# Patient Record
Sex: Male | Born: 2000 | Race: Black or African American | Hispanic: No | Marital: Single | Smoking: Never smoker
Health system: Southern US, Community
[De-identification: ages and names within clinical notes are randomized; demographics above are authoritative.]

---
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
## 2019-04-06 IMAGING — CR DG CHEST 2V
2 series · 2 of 2 positions shown · non-contrast
Comparison: None.

CLINICAL DATA: Chest pain

EXAM:
CHEST  2 VIEW

[chest pa]
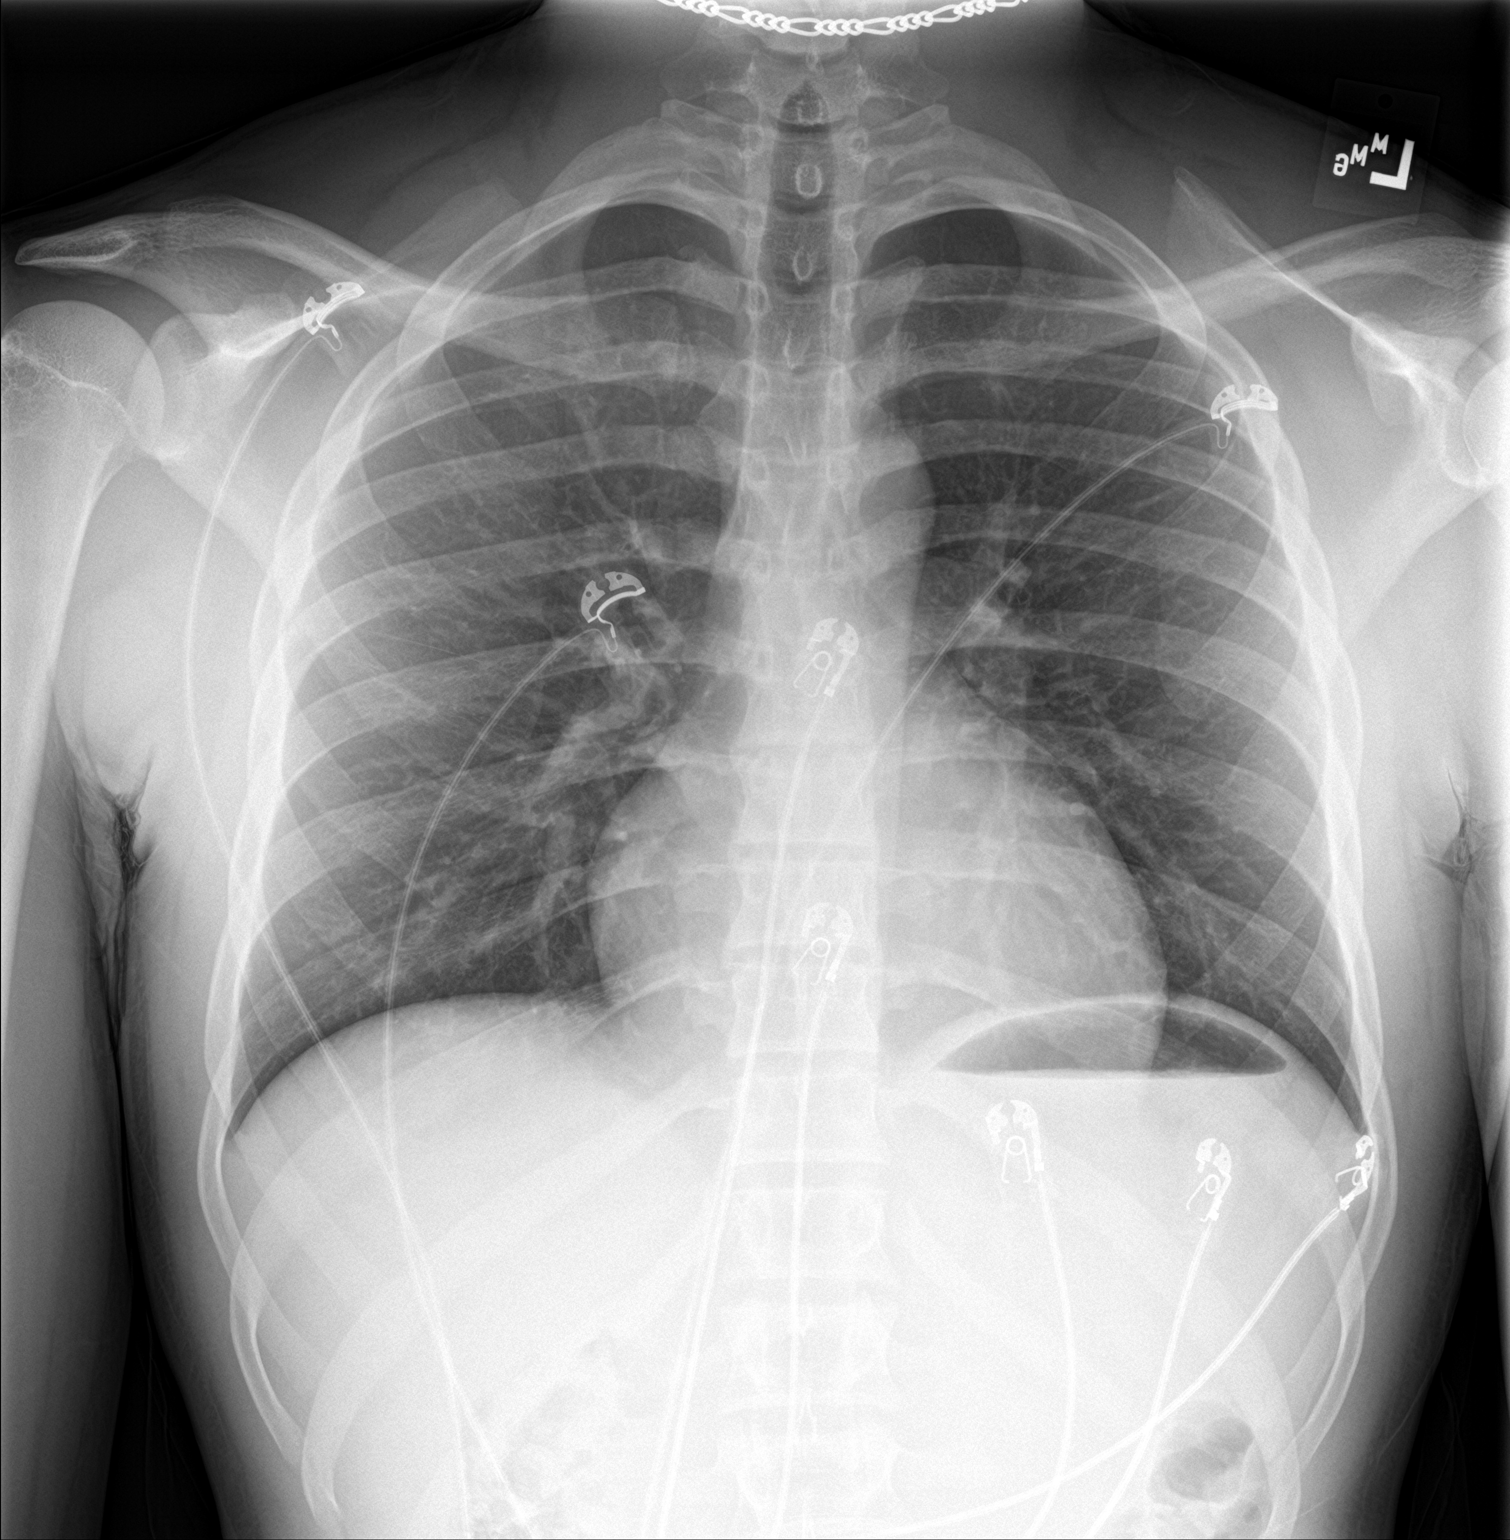

[chest lat]
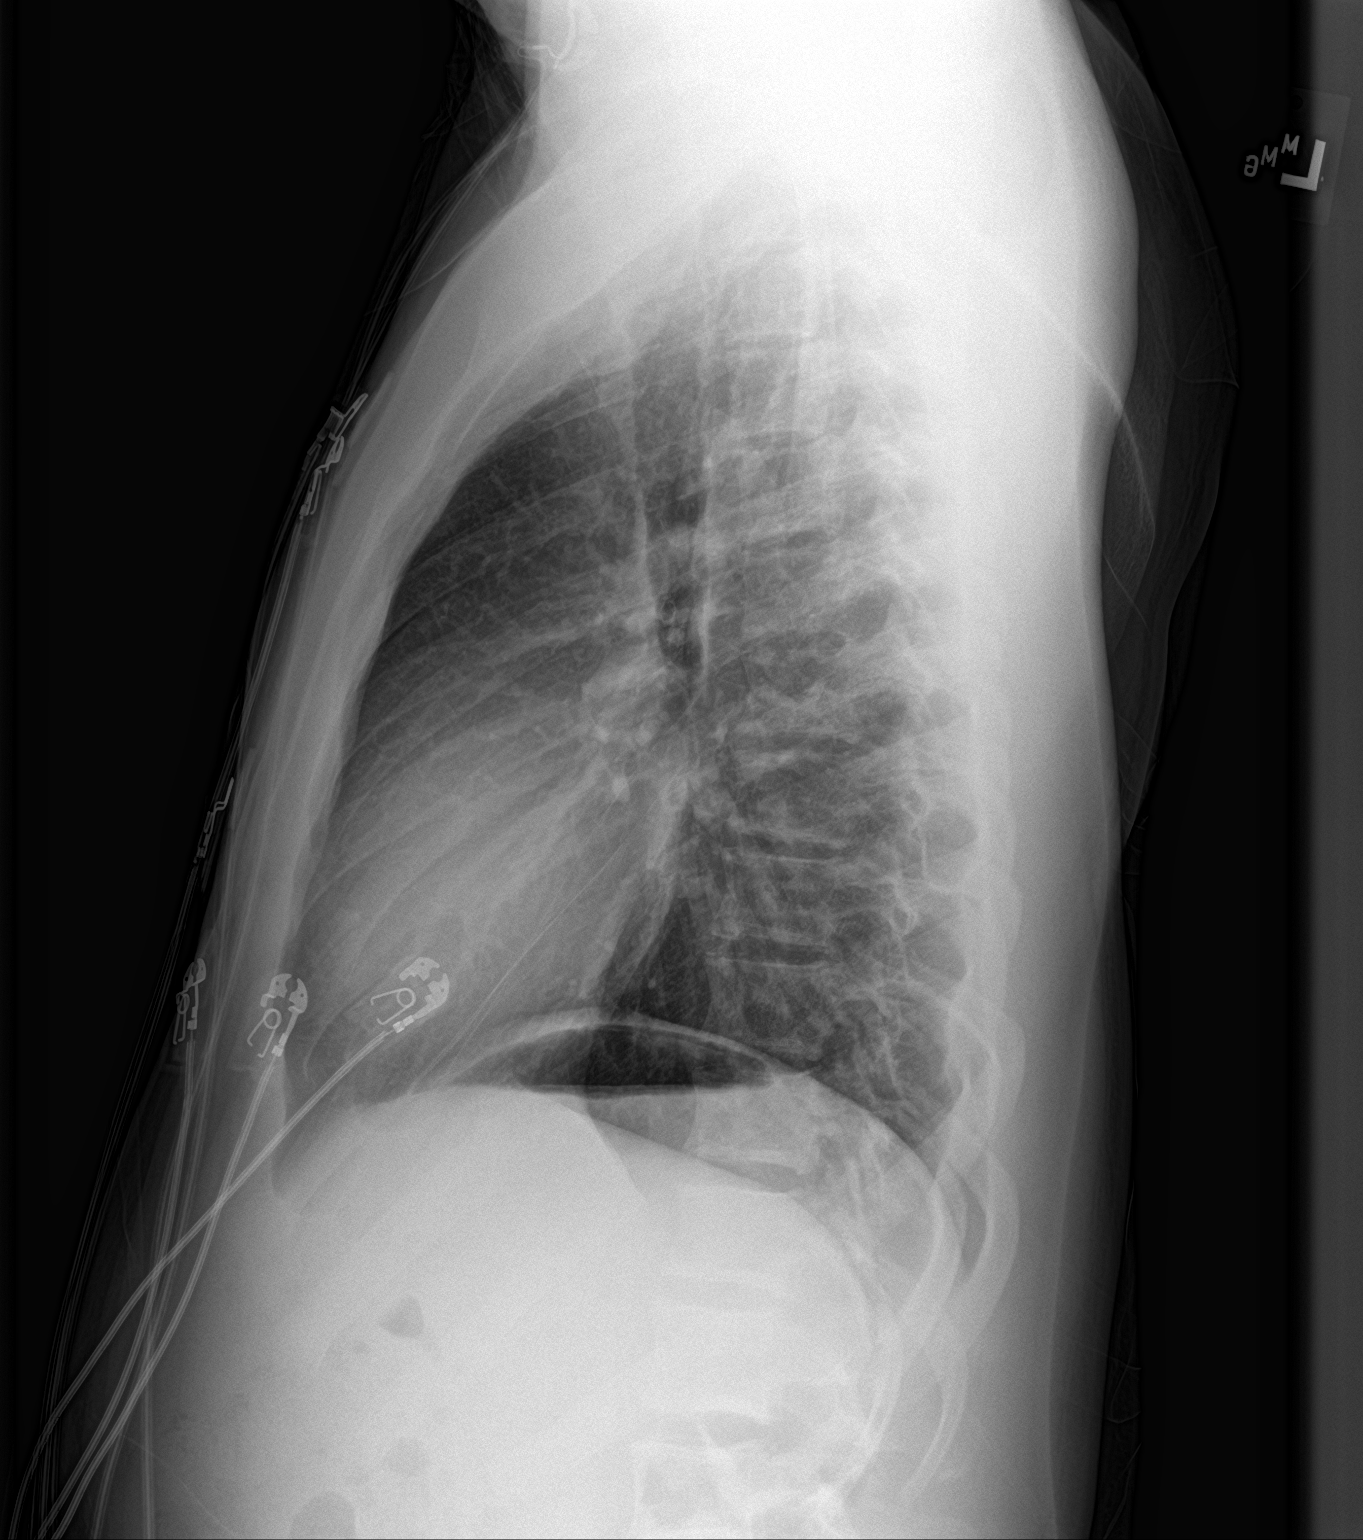

[2 of 2 positions shown; findings below may reference images not displayed]

FINDINGS: The heart size and mediastinal contours are within normal limits.
Both lungs are clear. The visualized skeletal structures are
unremarkable.
IMPRESSION: No active cardiopulmonary disease.

## 2020-05-21 ENCOUNTER — Ambulatory Visit: Payer: Self-pay | Attending: Internal Medicine

## 2020-05-21 DIAGNOSIS — Z23 Encounter for immunization: Secondary | ICD-10-CM

## 2020-05-21 NOTE — Progress Notes (Signed)
   Covid-19 Vaccination Clinic  Name:  Ronald Thomas    MRN: [id number] DOB: 08-22-2001  05/21/2020  [name] was observed post Covid-19 immunization for 15 minutes without incident. She was provided with Vaccine Information Sheet and instruction to access the V-Safe system.   [name] was instructed to call 911 with any severe reactions post vaccine: Marland Kitchen Difficulty breathing  . Swelling of face and throat  . A fast heartbeat  . A bad rash all over body  . Dizziness and weakness   Immunizations Administered    Name Date Dose VIS Date Route   Pfizer COVID-19 Vaccine 05/21/2020 11:10 AM 0.3 mL 02/10/2019 Intramuscular   Manufacturer: [employer], [employer]   Lot: [id number]   NDC: [address]-3

## 2020-06-13 ENCOUNTER — Ambulatory Visit: Payer: Self-pay | Attending: Internal Medicine

## 2020-06-13 DIAGNOSIS — Z23 Encounter for immunization: Secondary | ICD-10-CM

## 2020-06-13 NOTE — Progress Notes (Signed)
   Covid-19 Vaccination Clinic  Name:  KEYRON POKORSKI    MRN: [id number] DOB: 2001/10/20  06/13/2020  Mr. Aung was observed post Covid-19 immunization for 15 minutes without incident. He was provided with Vaccine Information Sheet and instruction to access the V-Safe system.   Mr. Flanigan was instructed to call 911 with any severe reactions post vaccine: Marland Kitchen Difficulty breathing  . Swelling of face and throat  . A fast heartbeat  . A bad rash all over body  . Dizziness and weakness   Immunizations Administered    Name Date Dose VIS Date Route   Pfizer COVID-19 Vaccine 06/13/2020 11:04 AM 0.3 mL 02/10/2019 Intramuscular   Manufacturer: [employer], [employer]   Lot: [id number]   NDC: [address]-8

## 2020-12-05 ENCOUNTER — Other Ambulatory Visit: Payer: Self-pay

## 2020-12-05 ENCOUNTER — Ambulatory Visit (INDEPENDENT_AMBULATORY_CARE_PROVIDER_SITE_OTHER): Payer: 59 | Admitting: Family Medicine

## 2020-12-05 ENCOUNTER — Encounter: Payer: Self-pay | Admitting: Family Medicine

## 2020-12-05 DIAGNOSIS — Z823 Family history of stroke: Secondary | ICD-10-CM | POA: Diagnosis not present

## 2020-12-05 DIAGNOSIS — L709 Acne, unspecified: Secondary | ICD-10-CM

## 2020-12-05 NOTE — Assessment & Plan Note (Signed)
Minocycline for the past 6 yrs through derm. Seems to be tolerating well.  Reviewed photosensitivity effect.

## 2020-12-05 NOTE — Assessment & Plan Note (Signed)
Consider FLP next visit.

## 2020-12-05 NOTE — Patient Instructions (Addendum)
You are doing well today Return as needed or in 1 year for physical.  Good luck with school.

## 2020-12-05 NOTE — Progress Notes (Signed)
Patient ID: Ronald Thomas, male    DOB: [birth year], 19 y.o.   MRN: [id number]  This visit was conducted in person.  BP 120/68 (BP Location: Left Arm, Patient Position: Sitting, Cuff Size: Normal)   Pulse 62   Temp 97.8 F (36.6 C) (Temporal)   Ht 6' (1.829 m)   Wt 166 lb 7 oz (75.5 kg)   SpO2 97%   BMI 22.57 kg/m    CC: new pt to establish care Subjective:   HPI: Ronald Thomas is a 19 y.o. male presenting on 12/05/2020 for New Patient (Initial Visit)   Previously saw Dr Donnie Coffin pediatrician. Transitioning to adult practice.   On minocycline for acne started 2015 as well as cetaphil bar of soap. Initially prescribed by derm.   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  Enjoys basketball, lifting weights, enjoys talking to GF at [facility].   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 Interim medical history since our last visit reviewed. Allergies and medications reviewed and updated. Outpatient Medications Prior to Visit  Medication Sig Dispense Refill  . minocycline (MINOCIN,DYNACIN) 100 MG capsule Take 100 mg by mouth 2 (two) times daily.     No facility-administered medications prior to visit.    Past Medical History:  Diagnosis Date  . History of chicken pox 2006   Past Surgical History:  Procedure Laterality Date  . 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 Stroke Mother   . Stroke Maternal Grandmother   . Diabetes Maternal Grandfather   . High blood pressure Maternal Grandfather   . Heart attack Paternal Grandmother   . Diabetes Paternal Grandfather   . Heart attack Paternal Grandfather   . Cancer Paternal Aunt      Social History   Tobacco Use  . Smoking status: Never Smoker  . Smokeless tobacco: Never Used  Substance Use Topics  . Alcohol use: Never  . Drug use: Not Currently    Types: Marijuana    Per HPI unless specifically indicated in ROS section below Review of Systems Objective:  BP 120/68 (BP Location: Left Arm, Patient Position: Sitting, Cuff Size: Normal)   Pulse 62   Temp 97.8 F (36.6 C) (Temporal)   Ht 6' (1.829 m)   Wt 166 lb 7 oz (75.5 kg)   SpO2 97%   BMI 22.57 kg/m   Wt Readings from Last 3 Encounters:  12/05/20 166 lb 7 oz (75.5 kg)  12/28/17 187 lb 13.3 oz (85.2 kg) (90 %, Z= 1.30)*   * Growth percentiles are based on CDC (Boys, 2-20 Years) data.      Physical Exam Vitals and nursing note reviewed.  Constitutional:      Appearance: Normal appearance. He is not ill-appearing.  HENT:     Head: Normocephalic and atraumatic.  Eyes:     Extraocular Movements: Extraocular movements intact.     Pupils: Pupils are equal, round, and reactive to light.  Cardiovascular:     Rate and Rhythm: Normal rate and regular rhythm.     Pulses: Normal pulses.     Heart sounds: Normal heart sounds. No murmur  heard.   Pulmonary:     Effort: Pulmonary effort is normal. No respiratory distress.     Breath sounds: Normal breath sounds. No wheezing, rhonchi or rales.  Abdominal:     General: Abdomen is flat. Bowel sounds are normal. There is no distension.     Palpations: Abdomen is soft. There is no mass.     Tenderness: There is no abdominal tenderness. There is no guarding or rebound.     Hernia: No hernia is present.  Musculoskeletal:     Right lower leg: No edema.     Left lower leg: No edema.  Skin:    General: Skin is warm and dry.     Findings: No rash.  Neurological:     Mental Status: He is alert.  Psychiatric:        Mood and Affect: Mood normal.        Behavior: Behavior normal.       No results found for this or any previous visit. Assessment & Plan:   This visit occurred during the SARS-CoV-2 public health emergency.  Safety protocols were in place, including screening questions prior to the visit, additional usage of staff PPE, and extensive cleaning of exam room while observing appropriate contact time as indicated for disinfecting solutions.   Problem List Items Addressed This Visit    Family history of cerebrovascular accident (CVA) in mother    Consider FLP next visit.       Acne    Minocycline for the past 6 yrs through derm. Seems to be tolerating well.  Reviewed photosensitivity effect.           No orders of the defined types were placed in this encounter.  No orders of the defined types were placed in this encounter.   Patient Instructions  You are doing well today Return as needed or in 1 year for physical.  Good luck with school.    Follow up plan: Return in about 1 year (around 12/05/2021) for annual exam, prior fasting for blood work.  Eustaquio Boyden, MD

## 2020-12-26 ENCOUNTER — Other Ambulatory Visit: Payer: Self-pay

## 2020-12-26 DIAGNOSIS — Z20822 Contact with and (suspected) exposure to covid-19: Secondary | ICD-10-CM

## 2020-12-28 LAB — SARS-COV-2, NAA 2 DAY TAT

## 2020-12-28 LAB — NOVEL CORONAVIRUS, NAA: SARS-CoV-2, NAA: NOT DETECTED

## 2021-09-08 ENCOUNTER — Ambulatory Visit: Payer: 59 | Admitting: Family Medicine

## 2021-12-17 DIAGNOSIS — S99199A Other physeal fracture of unspecified metatarsal, initial encounter for closed fracture: Secondary | ICD-10-CM

## 2021-12-17 HISTORY — DX: Other physeal fracture of unspecified metatarsal, initial encounter for closed fracture: S99.199A

## 2022-01-18 ENCOUNTER — Other Ambulatory Visit (HOSPITAL_COMMUNITY): Payer: Self-pay | Admitting: Orthopedic Surgery

## 2022-01-19 ENCOUNTER — Other Ambulatory Visit: Payer: Self-pay

## 2022-01-19 ENCOUNTER — Encounter (HOSPITAL_BASED_OUTPATIENT_CLINIC_OR_DEPARTMENT_OTHER): Payer: Self-pay | Admitting: Orthopedic Surgery

## 2022-01-24 NOTE — Progress Notes (Signed)

## 2022-01-25 ENCOUNTER — Ambulatory Visit (HOSPITAL_BASED_OUTPATIENT_CLINIC_OR_DEPARTMENT_OTHER): Payer: BC Managed Care – PPO

## 2022-01-25 ENCOUNTER — Other Ambulatory Visit: Payer: Self-pay

## 2022-01-25 ENCOUNTER — Encounter (HOSPITAL_BASED_OUTPATIENT_CLINIC_OR_DEPARTMENT_OTHER): Payer: Self-pay | Admitting: Orthopedic Surgery

## 2022-01-25 ENCOUNTER — Encounter (HOSPITAL_BASED_OUTPATIENT_CLINIC_OR_DEPARTMENT_OTHER)
Admission: RE | Disposition: A | Discharge status: Home / Self Care | Payer: Self-pay | Source: Ambulatory Visit | Attending: Orthopedic Surgery

## 2022-01-25 ENCOUNTER — Ambulatory Visit (HOSPITAL_BASED_OUTPATIENT_CLINIC_OR_DEPARTMENT_OTHER)
Admission: RE | Admit: 2022-01-25 | Discharge: 2022-01-25 | Disposition: A | Discharge status: Home / Self Care | Payer: BC Managed Care – PPO | Source: Ambulatory Visit | Attending: Orthopedic Surgery | Admitting: Orthopedic Surgery

## 2022-01-25 ENCOUNTER — Ambulatory Visit (HOSPITAL_BASED_OUTPATIENT_CLINIC_OR_DEPARTMENT_OTHER): Payer: BC Managed Care – PPO | Admitting: Anesthesiology

## 2022-01-25 DIAGNOSIS — S92351A Displaced fracture of fifth metatarsal bone, right foot, initial encounter for closed fracture: Secondary | ICD-10-CM

## 2022-01-25 DIAGNOSIS — X58XXXA Exposure to other specified factors, initial encounter: Secondary | ICD-10-CM | POA: Diagnosis not present

## 2022-01-25 DIAGNOSIS — S92351D Displaced fracture of fifth metatarsal bone, right foot, subsequent encounter for fracture with routine healing: Secondary | ICD-10-CM

## 2022-01-25 DIAGNOSIS — Y9367 Activity, basketball: Secondary | ICD-10-CM | POA: Diagnosis not present

## 2022-01-25 HISTORY — PX: ORIF TOE FRACTURE: SHX5032

## 2022-01-25 SURGERY — OPEN REDUCTION INTERNAL FIXATION (ORIF) METATARSAL (TOE) FRACTURE
Anesthesia: General | Site: Foot | Laterality: Right

## 2022-01-25 MED ORDER — CEFAZOLIN SODIUM-DEXTROSE 2-4 GM/100ML-% IV SOLN
2.0000 g | INTRAVENOUS | Status: AC
  Administered 2022-01-25: 2 g via INTRAVENOUS

## 2022-01-25 MED ORDER — FENTANYL CITRATE (PF) 100 MCG/2ML IJ SOLN
INTRAMUSCULAR | Status: DC | PRN
  Administered 2022-01-25 (×3): 25 ug via INTRAVENOUS
  Administered 2022-01-25: 50 ug via INTRAVENOUS

## 2022-01-25 MED ORDER — LACTATED RINGERS IV SOLN: INTRAVENOUS | Status: DC

## 2022-01-25 MED ORDER — BUPIVACAINE-EPINEPHRINE 0.5% -1:200000 IJ SOLN
INTRAMUSCULAR | Status: DC | PRN
  Administered 2022-01-25: 10 mL

## 2022-01-25 MED ORDER — DEXAMETHASONE SODIUM PHOSPHATE 10 MG/ML IJ SOLN
INTRAMUSCULAR | Status: DC | PRN
  Administered 2022-01-25: 4 mg via INTRAVENOUS

## 2022-01-25 MED ORDER — FENTANYL CITRATE (PF) 100 MCG/2ML IJ SOLN
INTRAMUSCULAR | Status: AC
  Filled 2022-01-25: qty 2

## 2022-01-25 MED ORDER — ONDANSETRON HCL 4 MG/2ML IJ SOLN
INTRAMUSCULAR | Status: DC | PRN
  Administered 2022-01-25: 4 mg via INTRAVENOUS

## 2022-01-25 MED ORDER — VANCOMYCIN HCL 500 MG IV SOLR
INTRAVENOUS | Status: AC
  Filled 2022-01-25: qty 10

## 2022-01-25 MED ORDER — ACETAMINOPHEN 500 MG PO TABS
ORAL_TABLET | ORAL | Status: AC
  Filled 2022-01-25: qty 2

## 2022-01-25 MED ORDER — MIDAZOLAM HCL 5 MG/5ML IJ SOLN
INTRAMUSCULAR | Status: DC | PRN
  Administered 2022-01-25: 2 mg via INTRAVENOUS

## 2022-01-25 MED ORDER — AMISULPRIDE (ANTIEMETIC) 5 MG/2ML IV SOLN: 10.0000 mg | Freq: Once | INTRAVENOUS | Status: DC | PRN

## 2022-01-25 MED ORDER — PROPOFOL 500 MG/50ML IV EMUL
INTRAVENOUS | Status: DC | PRN
  Administered 2022-01-25: 25 ug/kg/min via INTRAVENOUS

## 2022-01-25 MED ORDER — CEFAZOLIN SODIUM-DEXTROSE 2-4 GM/100ML-% IV SOLN
INTRAVENOUS | Status: AC
  Filled 2022-01-25: qty 100

## 2022-01-25 MED ORDER — ACETAMINOPHEN 500 MG PO TABS
1000.0000 mg | ORAL_TABLET | Freq: Once | ORAL | Status: AC
  Administered 2022-01-25: 1000 mg via ORAL

## 2022-01-25 MED ORDER — 0.9 % SODIUM CHLORIDE (POUR BTL) OPTIME
TOPICAL | Status: DC | PRN
  Administered 2022-01-25: 120 mL

## 2022-01-25 MED ORDER — PROPOFOL 10 MG/ML IV BOLUS
INTRAVENOUS | Status: DC | PRN
  Administered 2022-01-25: 200 mg via INTRAVENOUS

## 2022-01-25 MED ORDER — OXYCODONE HCL 5 MG PO TABS: 5.0000 mg | ORAL_TABLET | Freq: Four times a day (QID) | ORAL | 0 refills | Status: AC | PRN

## 2022-01-25 MED ORDER — OXYCODONE HCL 5 MG PO TABS
ORAL_TABLET | ORAL | Status: AC
  Filled 2022-01-25: qty 1

## 2022-01-25 MED ORDER — CELECOXIB 200 MG PO CAPS
ORAL_CAPSULE | ORAL | Status: AC
  Filled 2022-01-25: qty 1

## 2022-01-25 MED ORDER — OXYCODONE HCL 5 MG PO TABS
5.0000 mg | ORAL_TABLET | Freq: Once | ORAL | Status: AC
Start: 2022-01-25 — End: 2022-01-25
  Administered 2022-01-25: 5 mg via ORAL

## 2022-01-25 MED ORDER — MIDAZOLAM HCL 2 MG/2ML IJ SOLN
INTRAMUSCULAR | Status: AC
  Filled 2022-01-25: qty 2

## 2022-01-25 MED ORDER — CELECOXIB 200 MG PO CAPS
200.0000 mg | ORAL_CAPSULE | Freq: Once | ORAL | Status: AC
  Administered 2022-01-25: 200 mg via ORAL

## 2022-01-25 MED ORDER — PROMETHAZINE HCL 25 MG/ML IJ SOLN: 6.2500 mg | INTRAMUSCULAR | Status: DC | PRN

## 2022-01-25 MED ORDER — SODIUM CHLORIDE 0.9 % IV SOLN: INTRAVENOUS | Status: DC

## 2022-01-25 MED ORDER — VANCOMYCIN HCL 500 MG IV SOLR
INTRAVENOUS | Status: DC | PRN
  Administered 2022-01-25: 500 mg

## 2022-01-25 MED ORDER — FENTANYL CITRATE (PF) 100 MCG/2ML IJ SOLN
25.0000 ug | INTRAMUSCULAR | Status: DC | PRN
  Administered 2022-01-25 (×2): 50 ug via INTRAVENOUS

## 2022-01-25 MED ORDER — LIDOCAINE 2% (20 MG/ML) 5 ML SYRINGE
INTRAMUSCULAR | Status: DC | PRN
Start: 2022-01-25 — End: 2022-01-25
  Administered 2022-01-25: 60 mg via INTRAVENOUS

## 2022-01-25 SURGICAL SUPPLY — 72 items
APL PRP STRL LF DISP 70% ISPRP (MISCELLANEOUS) ×1
BANDAGE ESMARK 6X9 LF (GAUZE/BANDAGES/DRESSINGS) IMPLANT
BIT DRILL CANNULATED 3.8MM (DRILL) IMPLANT
BLADE SURG 15 STRL LF DISP TIS (BLADE) ×4 IMPLANT
BLADE SURG 15 STRL SS (BLADE) ×4
BNDG CMPR 9X4 STRL LF SNTH (GAUZE/BANDAGES/DRESSINGS)
BNDG CMPR 9X6 STRL LF SNTH (GAUZE/BANDAGES/DRESSINGS)
BNDG COHESIVE 4X5 TAN ST LF (GAUZE/BANDAGES/DRESSINGS) ×3 IMPLANT
BNDG COHESIVE 6X5 TAN ST LF (GAUZE/BANDAGES/DRESSINGS) IMPLANT
BNDG CONFORM 2 STRL LF (GAUZE/BANDAGES/DRESSINGS) ×3 IMPLANT
BNDG ELASTIC 4X5.8 VLCR STR LF (GAUZE/BANDAGES/DRESSINGS) IMPLANT
BNDG ESMARK 4X9 LF (GAUZE/BANDAGES/DRESSINGS) IMPLANT
BNDG ESMARK 6X9 LF (GAUZE/BANDAGES/DRESSINGS)
BOOT STEPPER DURA LG (SOFTGOODS) ×1 IMPLANT
BOOT STEPPER DURA MED (SOFTGOODS) IMPLANT
CANISTER SUCT 1200ML W/VALVE (MISCELLANEOUS) ×3 IMPLANT
CHLORAPREP W/TINT 26 (MISCELLANEOUS) ×3 IMPLANT
COUNTER SINK 5.5 (MISCELLANEOUS) ×2
COVER BACK TABLE 60X90IN (DRAPES) ×3 IMPLANT
CUFF TOURN SGL QUICK 34 (TOURNIQUET CUFF)
CUFF TRNQT CYL 34X4.125X (TOURNIQUET CUFF) IMPLANT
DRAPE EXTREMITY T 121X128X90 (DISPOSABLE) ×3 IMPLANT
DRAPE OEC MINIVIEW 54X84 (DRAPES) ×3 IMPLANT
DRAPE U-SHAPE 47X51 STRL (DRAPES) ×3 IMPLANT
DRILL CANNULATED 3.8MM (DRILL) ×2
DRSG MEPITEL 4X7.2 (GAUZE/BANDAGES/DRESSINGS) ×3 IMPLANT
DRSG PAD ABDOMINAL 8X10 ST (GAUZE/BANDAGES/DRESSINGS) ×6 IMPLANT
ELECT REM PT RETURN 9FT ADLT (ELECTROSURGICAL) ×2
ELECTRODE REM PT RTRN 9FT ADLT (ELECTROSURGICAL) ×2 IMPLANT
GAUZE SPONGE 4X4 12PLY STRL (GAUZE/BANDAGES/DRESSINGS) ×3 IMPLANT
GLOVE SRG 8 PF TXTR STRL LF DI (GLOVE) ×4 IMPLANT
GLOVE SURG ENC MOIS LTX SZ8 (GLOVE) ×3 IMPLANT
GLOVE SURG LTX SZ8 (GLOVE) ×3 IMPLANT
GLOVE SURG UNDER POLY LF SZ8 (GLOVE) ×4
GOWN STRL REUS W/ TWL LRG LVL3 (GOWN DISPOSABLE) ×2 IMPLANT
GOWN STRL REUS W/ TWL XL LVL3 (GOWN DISPOSABLE) ×4 IMPLANT
GOWN STRL REUS W/TWL LRG LVL3 (GOWN DISPOSABLE) ×2
GOWN STRL REUS W/TWL XL LVL3 (GOWN DISPOSABLE) ×4
NEEDLE HYPO 22GX1.5 SAFETY (NEEDLE) IMPLANT
NS IRRIG 1000ML POUR BTL (IV SOLUTION) ×3 IMPLANT
PACK BASIN DAY SURGERY FS (CUSTOM PROCEDURE TRAY) ×3 IMPLANT
PAD CAST 4YDX4 CTTN HI CHSV (CAST SUPPLIES) ×2 IMPLANT
PADDING CAST ABS 4INX4YD NS (CAST SUPPLIES)
PADDING CAST ABS COTTON 4X4 ST (CAST SUPPLIES) IMPLANT
PADDING CAST COTTON 4X4 STRL (CAST SUPPLIES) ×2
PADDING CAST COTTON 6X4 STRL (CAST SUPPLIES) IMPLANT
PENCIL SMOKE EVACUATOR (MISCELLANEOUS) ×3 IMPLANT
SANITIZER HAND PURELL 535ML FO (MISCELLANEOUS) ×3 IMPLANT
SCREW COUNTERSINK 5.5 (MISCELLANEOUS) IMPLANT
SCREW SOLID MONSTER BT 5.5X40 (Screw) ×1 IMPLANT
SHEET MEDIUM DRAPE 40X70 STRL (DRAPES) ×3 IMPLANT
SLEEVE SCD COMPRESS KNEE MED (STOCKING) ×3 IMPLANT
SPIKE FLUID TRANSFER (MISCELLANEOUS) IMPLANT
SPONGE T-LAP 18X18 ~~LOC~~+RFID (SPONGE) ×3 IMPLANT
STOCKINETTE 6  STRL (DRAPES) ×2
STOCKINETTE 6 STRL (DRAPES) ×2 IMPLANT
SUCTION FRAZIER HANDLE 10FR (MISCELLANEOUS) ×2
SUCTION TUBE FRAZIER 10FR DISP (MISCELLANEOUS) ×2 IMPLANT
SUT ETHILON 3 0 PS 1 (SUTURE) ×3 IMPLANT
SUT FIBERWIRE #2 38 T-5 BLUE (SUTURE)
SUT MNCRL AB 3-0 PS2 18 (SUTURE) IMPLANT
SUT VIC AB 2-0 SH 27 (SUTURE)
SUT VIC AB 2-0 SH 27XBRD (SUTURE) IMPLANT
SUT VICRYL 0 SH 27 (SUTURE) IMPLANT
SUTURE FIBERWR #2 38 T-5 BLUE (SUTURE) IMPLANT
SYR BULB EAR ULCER 3OZ GRN STR (SYRINGE) ×3 IMPLANT
SYR CONTROL 10ML LL (SYRINGE) IMPLANT
TAP 5.5 (TAP) ×1 IMPLANT
TOWEL GREEN STERILE FF (TOWEL DISPOSABLE) ×6 IMPLANT
TUBE CONNECTING 20X1/4 (TUBING) ×3 IMPLANT
UNDERPAD 30X36 HEAVY ABSORB (UNDERPADS AND DIAPERS) ×3 IMPLANT
WIRE SMOOTH NITINOL 1.6X200 (WIRE) ×1 IMPLANT

## 2022-01-25 NOTE — Anesthesia Procedure Notes (Signed)
Procedure Name: LMA Insertion Date/Time: 01/25/2022 12:13 PM Performed by: Sheryn Bison, CRNA Pre-anesthesia Checklist: Patient identified, Emergency Drugs available, Suction available and Patient being monitored Patient Re-evaluated:Patient Re-evaluated prior to induction Oxygen Delivery Method: Circle System Utilized Preoxygenation: Pre-oxygenation with 100% oxygen Induction Type: IV induction Ventilation: Mask ventilation without difficulty LMA: LMA inserted LMA Size: 4.0 Number of attempts: 1 Airway Equipment and Method: bite block Placement Confirmation: positive ETCO2 Tube secured with: Tape Dental Injury: Teeth and Oropharynx as per pre-operative assessment

## 2022-01-25 NOTE — Discharge Instructions (Addendum)
Toni Arthurs, MD [facility]  Please read the following information regarding your care after surgery.  Medications  You only need a prescription for the narcotic pain medicine (ex. oxycodone, Percocet, Norco).  All of the other medicines listed below are available over the counter. X Aleve 2 pills twice a day for the first 3 days after surgery. X acetominophen (Tylenol) 650 mg every 4-6 hours as you need for minor to moderate pain X oxycodone as prescribed for severe pain  Narcotic pain medicine (ex. oxycodone, Percocet, Vicodin) will cause constipation.  To prevent this problem, take the following medicines while you are taking any pain medicine. X docusate sodium (Colace) 100 mg twice a day X senna (Senokot) 2 tablets twice a day  Weight Bearing X Bear weight only on your operated foot in the cam boot.  Cast / Splint / Dressing X Keep your splint, cast or dressing clean and dry.  Dont put anything (coat hanger, pencil, etc) down inside of it.  If it gets damp, use a hair dryer on the cool setting to dry it.  If it gets soaked, call the office to schedule an appointment for a cast change.  After your dressing, cast or splint is removed; you may shower, but do not soak or scrub the wound.  Allow the water to run over it, and then gently pat it dry.  Swelling It is normal for you to have swelling where you had surgery.  To reduce swelling and pain, keep your toes above your nose for at least 3 days after surgery.  It may be necessary to keep your foot or leg elevated for several weeks.  If it hurts, it should be elevated.  Follow Up Call my office at [phone] when you are discharged from the hospital or surgery center to schedule an appointment to be seen two weeks after surgery.  Call my office at [phone] if you develop a fever >101.5 F, nausea, vomiting, bleeding from the surgical site or severe pain.     Post Anesthesia Home Care Instructions  Activity: Get plenty of  rest for the remainder of the day. A responsible individual must stay with you for 24 hours following the procedure.  For the next 24 hours, DO NOT: -Drive a car -Advertising copywriter -Drink alcoholic beverages -Take any medication unless instructed by your physician -Make any legal decisions or sign important papers.  Meals: Start with liquid foods such as gelatin or soup. Progress to regular foods as tolerated. Avoid greasy, spicy, heavy foods. If nausea and/or vomiting occur, drink only clear liquids until the nausea and/or vomiting subsides. Call your physician if vomiting continues.  Special Instructions/Symptoms: Your throat may feel dry or sore from the anesthesia or the breathing tube placed in your throat during surgery. If this causes discomfort, gargle with warm salt water. The discomfort should disappear within 24 hours.  If you had a scopolamine patch placed behind your ear for the management of post- operative nausea and/or vomiting:  1. The medication in the patch is effective for 72 hours, after which it should be removed.  Wrap patch in a tissue and discard in the trash. Wash hands thoroughly with soap and water. 2. You may remove the patch earlier than 72 hours if you experience unpleasant side effects which may include dry mouth, dizziness or visual disturbances. 3. Avoid touching the patch. Wash your hands with soap and water after contact with the patch.    Next tylenol dose 4pm next motrin 6pm

## 2022-01-25 NOTE — Transfer of Care (Signed)
Immediate Anesthesia Transfer of Care Note  Patient: Ronald Thomas  Procedure(s) Performed: Open reduction internal fixation Right Fifth metatarsal jones fracture (Right: Foot)  Patient Location: PACU  Anesthesia Type:General  Level of Consciousness: drowsy and patient cooperative  Airway & Oxygen Therapy: Patient Spontanous Breathing and Patient connected to face mask oxygen  Post-op Assessment: Report given to RN and Post -op Vital signs reviewed and stable  Post vital signs: Reviewed and stable  Last Vitals:  Vitals Value Taken Time  BP 112/47 01/25/22 1303  Temp    Pulse 56 01/25/22 1304  Resp 11 01/25/22 1304  SpO2 100 % 01/25/22 1304  Vitals shown include unvalidated device data.  Last Pain:  Vitals:   01/25/22 1029  TempSrc: Oral  PainSc: 0-No pain      Patients Stated Pain Goal: 8 (01/25/22 1029)  Complications: No notable events documented.

## 2022-01-25 NOTE — H&P (Signed)
Ronald Thomas is an 21 y.o. male.   Chief Complaint: Right foot pain HPI: 21 year old male without significant past medical history has a displaced fracture of the fifth metatarsal base in zone 2 after a basketball injury.  He presents today for surgical treatment of this displaced fracture.  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 He has never used smokeless tobacco. He reports that he does not currently use drugs after having used the following drugs: Marijuana. He reports that he does not drink alcohol.  Allergies: No Known Allergies  Medications Prior to Admission  Medication Sig Dispense Refill   HYDROcodone-acetaminophen (NORCO/VICODIN) 5-325 MG tablet Take 1 tablet by mouth every 6 (six) hours as needed for moderate pain.      No results found for this or any previous visit (from the past 48 hour(s)). No results found.  Review of Systems no recent fever, chills, nausea, vomiting or changes in his appetite  Blood pressure (!) 153/87, pulse 70, temperature 99 F (37.2 C), temperature source Oral, resp. rate 20, height 6' (1.829 m), weight 77.7 kg, SpO2 100 %. Physical Exam  Well-nourished well-developed male in no apparent distress.  Alert and oriented.  Normal mood and affect.  Gait is nonweightbearing on the right.  The right ankle has some swelling laterally.  Skin is healthy and intact.  Pulses are palpable.  No lymphadenopathy.  Intact  sensibility to light touch in the sural nerve distribution.   Assessment/Plan Right fifth metatarsal base zone 2 fracture -to the operating room today for open treatment with intramedullary fixation.  The risks and benefits of the alternative treatment options have been discussed in detail.  The patient wishes to proceed with surgery and specifically understands risks of bleeding, infection, nerve damage, blood clots, need for additional surgery, amputation and death.   Toni Arthurs, MD 02/04/22, 11:42 AM

## 2022-01-25 NOTE — Anesthesia Postprocedure Evaluation (Signed)
Anesthesia Post Note  Patient: Ronald Thomas  Procedure(s) Performed: Open reduction internal fixation Right Fifth metatarsal jones fracture (Right: Foot)     Patient location during evaluation: PACU Anesthesia Type: General Level of consciousness: sedated Pain management: pain level controlled Vital Signs Assessment: post-procedure vital signs reviewed and stable Respiratory status: spontaneous breathing and respiratory function stable Cardiovascular status: stable Postop Assessment: no apparent nausea or vomiting Anesthetic complications: no   No notable events documented.  Last Vitals:  Vitals:   01/25/22 1330 01/25/22 1345  BP: (!) 120/57 128/60  Pulse: (!) 41 (!) 53  Resp: 12 13  Temp:    SpO2: 100% 100%    Last Pain:  Vitals:   01/25/22 1345  TempSrc:   PainSc: 5                  [name]

## 2022-01-25 NOTE — Op Note (Signed)
01/25/2022  1:13 PM  PATIENT:  Ronald Thomas  21 y.o. male  PRE-OPERATIVE DIAGNOSIS:  Right 5th metatarsal fracture  POST-OPERATIVE DIAGNOSIS:  Right 5th metatarsal fracture  Procedure(s):  1.  Open treatment right fifth metatarsal fracture with internal fixation 2.  AP, lateral and oblique radiographs of the right foot  SURGEON:  Toni Arthurs, MD  ASSISTANT: None  ANESTHESIA:   General  EBL:  minimal   TOURNIQUET: Approximately 30 minutes with an ankle Esmarch  COMPLICATIONS:  None apparent  DISPOSITION:  Extubated, awake and stable to recovery.  INDICATION FOR PROCEDURE: 21 year old male without significant past medical history injured his right foot playing basketball about 2 weeks ago.  Radiographs reveal a displaced zone 2 fracture of the fifth metatarsal base.  He presents now for surgical treatment.   PROCEDURE IN DETAIL:  After pre operative consent was obtained, and the correct operative site was identified, the patient was brought to the operating room and placed supine on the OR table.  Anesthesia was administered.  Pre-operative antibiotics were administered.  A surgical timeout was taken. The right lower extremity was prepped and draped in standard sterile fashion.  The foot was exsanguinated and a 4 inch Esmarch tourniquet wrapped around the ankle.  A longitudinal incision was made at the base of the fifth metatarsal.  Dissection was carried down through the subcutaneous tissues.  The peroneus brevis tendon was identified.  It was protected.  A guidepin was inserted into the fifth metatarsal base in line with the medullary canal.  It was advanced across the fracture site.  A drill bit was advanced over the guidewire.  A tap for the 5.5 millimeter screw was advanced past the fracture site.  The tap and guidepin were removed.  A 5.5 mm partially-threaded screw from the [employer] set was advanced into the canal and across the fracture site.  It was seated deep to the articular  surface at the TMT joint.  AP, oblique and lateral radiographs confirmed appropriate reduction of the fracture in appropriate position and length of the screw.  Wound was irrigated copiously and sprinkled with vancomycin powder.  Skin incision was closed with horizontal mattress sutures of nylon.  The subcutaneous tissues were then infiltrated with half percent Marcaine with epinephrine for postoperative pain control.  The tourniquet was released after application of the dressings.  Cam boot was applied.  The patient was awakened from anesthesia and transported to the recovery room in stable condition.  FOLLOW UP PLAN: Weightbearing as tolerated in the cam boot.  Follow-up in the office in 2 weeks for suture removal.  Plan 6 weeks of weightbearing immobilization postoperatively.  No indication for DVT prophylaxis in this ambulatory patient.   RADIOGRAPHS: AP, lateral and oblique radiographs of the right foot are obtained intraoperatively.  These show interval reduction and fixation of the fifth metatarsal base fracture.  Hardware is appropriately positioned and of the appropriate length.  No other acute injuries are noted.

## 2022-01-25 NOTE — Anesthesia Preprocedure Evaluation (Addendum)
Anesthesia Evaluation  Patient identified by MRN, date of birth, ID band Patient awake    Reviewed: Allergy & Precautions, NPO status , Patient's Chart, lab work & pertinent test results  History of Anesthesia Complications Negative for: history of anesthetic complications  Airway Mallampati: II  TM Distance: >3 FB Neck ROM: Full    Dental no notable dental hx. (+) Dental Advisory Given   Pulmonary neg pulmonary ROS,    Pulmonary exam normal        Cardiovascular negative cardio ROS Normal cardiovascular exam     Neuro/Psych negative neurological ROS     GI/Hepatic negative GI ROS, Neg liver ROS,   Endo/Other  negative endocrine ROS  Renal/GU negative Renal ROS     Musculoskeletal   Abdominal   Peds  Hematology negative hematology ROS (+)   Anesthesia Other Findings   Reproductive/Obstetrics                            Anesthesia Physical Anesthesia Plan  ASA: 1  Anesthesia Plan: General   Post-op Pain Management: Celebrex PO (pre-op) and Tylenol PO (pre-op)   Induction:   PONV Risk Score and Plan: 2 and Ondansetron and Dexamethasone  Airway Management Planned: LMA  Additional Equipment:   Intra-op Plan:   Post-operative Plan: Extubation in OR  Informed Consent: I have reviewed the patients History and Physical, chart, labs and discussed the procedure including the risks, benefits and alternatives for the proposed anesthesia with the patient or authorized representative who has indicated his/her understanding and acceptance.     Dental advisory given  Plan Discussed with: Anesthesiologist, Surgeon and CRNA  Anesthesia Plan Comments: (Pt refused recommended preoperative nerve block.)      Anesthesia Quick Evaluation

## 2022-01-26 ENCOUNTER — Encounter (HOSPITAL_BASED_OUTPATIENT_CLINIC_OR_DEPARTMENT_OTHER): Payer: Self-pay | Admitting: Orthopedic Surgery

## 2022-09-10 ENCOUNTER — Encounter (HOSPITAL_BASED_OUTPATIENT_CLINIC_OR_DEPARTMENT_OTHER): Payer: Self-pay | Admitting: Orthopedic Surgery

## 2023-04-16 ENCOUNTER — Emergency Department (HOSPITAL_BASED_OUTPATIENT_CLINIC_OR_DEPARTMENT_OTHER)
Admission: EM | Admit: 2023-04-16 | Discharge: 2023-04-16 | Disposition: A | Discharge status: Home / Self Care | Payer: Self-pay | Attending: Emergency Medicine | Admitting: Emergency Medicine

## 2023-04-16 ENCOUNTER — Encounter (HOSPITAL_BASED_OUTPATIENT_CLINIC_OR_DEPARTMENT_OTHER): Payer: Self-pay

## 2023-04-16 ENCOUNTER — Other Ambulatory Visit: Payer: Self-pay

## 2023-04-16 ENCOUNTER — Other Ambulatory Visit (HOSPITAL_BASED_OUTPATIENT_CLINIC_OR_DEPARTMENT_OTHER): Payer: Self-pay

## 2023-04-16 DIAGNOSIS — Z202 Contact with and (suspected) exposure to infections with a predominantly sexual mode of transmission: Secondary | ICD-10-CM | POA: Insufficient documentation

## 2023-04-16 MED ORDER — DOXYCYCLINE HYCLATE 100 MG PO CAPS
100.0000 mg | ORAL_CAPSULE | Freq: Two times a day (BID) | ORAL | 0 refills | Status: AC
  Filled 2023-04-16: qty 14, 7d supply, fill #0

## 2023-04-16 MED ORDER — LIDOCAINE HCL (PF) 1 % IJ SOLN
INTRAMUSCULAR | Status: AC
  Administered 2023-04-16: 1 mL
  Filled 2023-04-16: qty 5

## 2023-04-16 MED ORDER — DOXYCYCLINE HYCLATE 100 MG PO TABS
100.0000 mg | ORAL_TABLET | Freq: Once | ORAL | Status: AC
  Administered 2023-04-16: 100 mg via ORAL
  Filled 2023-04-16: qty 1

## 2023-04-16 MED ORDER — CEFTRIAXONE SODIUM 500 MG IJ SOLR
500.0000 mg | Freq: Once | INTRAMUSCULAR | Status: AC
  Administered 2023-04-16: 500 mg via INTRAMUSCULAR
  Filled 2023-04-16: qty 500

## 2023-04-16 MED ORDER — LIDOCAINE HCL (PF) 1 % IJ SOLN: 1.0000 mL | Freq: Once | INTRAMUSCULAR | Status: AC

## 2023-04-16 NOTE — ED Provider Notes (Signed)
[place] EMERGENCY DEPARTMENT AT [facility] Provider Note   CSN: [id number] Arrival date & time: 04/16/23  1051     History  Chief Complaint  Patient presents with   Exposure to STD    Ronald Thomas is a 22 y.o. male.   Exposure to STD   22 year old male presents emergency department with complaints of exposure to STD.  Patient states that male partner most recently tested positive for chlamydia when she was seen earlier last week.  Patient currently endorses no symptoms.  Denies fever, abdominal pain, nausea, vomiting, dysuria, penile discharge.  Patient states he "just wants to be tested for gonorrhea and chlamydia."  Past medical history significant for Jones fracture, chickenpox  Home Medications Prior to Admission medications   Medication Sig Start Date End Date Taking? Authorizing Provider  doxycycline (VIBRAMYCIN) 100 MG capsule Take 1 capsule (100 mg total) by mouth 2 (two) times daily for 7 days. 04/16/23 04/23/23 Yes Peter Garter, PA      Allergies    Patient has no known allergies.    Review of Systems   Review of Systems  All other systems reviewed and are negative.   Physical Exam Updated Vital Signs BP (!) 144/70 (BP Location: Right Arm)   Pulse (!) 58   Temp 98.6 F (37 C) (Oral)   Resp 18   Ht 6\' 3"  (1.905 m)   Wt 74.8 kg   SpO2 100%   BMI 20.62 kg/m  Physical Exam Vitals and nursing note reviewed.  Constitutional:      General: He is not in acute distress.    Appearance: He is well-developed.  HENT:     Head: Normocephalic and atraumatic.  Eyes:     Conjunctiva/sclera: Conjunctivae normal.  Cardiovascular:     Rate and Rhythm: Normal rate and regular rhythm.     Heart sounds: No murmur heard. Pulmonary:     Effort: Pulmonary effort is normal. No respiratory distress.     Breath sounds: Normal breath sounds.  Abdominal:     Palpations: Abdomen is soft.     Tenderness: There is no abdominal tenderness.   Musculoskeletal:        General: No swelling.     Cervical back: Neck supple.  Skin:    General: Skin is warm and dry.     Capillary Refill: Capillary refill takes less than 2 seconds.  Neurological:     Mental Status: He is alert.  Psychiatric:        Mood and Affect: Mood normal.     ED Results / Procedures / Treatments   Labs (all labs ordered are listed, but only abnormal results are displayed) Labs Reviewed  GC/CHLAMYDIA PROBE AMP ([place]) NOT AT [facility]    EKG None  Radiology No results found.  Procedures Procedures    Medications Ordered in ED Medications  cefTRIAXone (ROCEPHIN) injection 500 mg (500 mg Intramuscular Given 04/16/23 1128)  doxycycline (VIBRA-TABS) tablet 100 mg (100 mg Oral Given 04/16/23 1128)  lidocaine (PF) (XYLOCAINE) 1 % injection 1 mL (1 mL Other Given 04/16/23 1129)    ED Course/ Medical Decision Making/ A&P                             Medical Decision Making Risk Prescription drug management.   This patient presents to the ED for concern of STD exposure, this involves an extensive number of treatment options, and is a  complaint that carries with it a high risk of complications and morbidity.  The differential diagnosis includes gonorrhea, chlamydia, HIV, syphilis   Co morbidities that complicate the patient evaluation  See HPI   Additional history obtained:  Additional history obtained from EMR External records from outside source obtained and reviewed including hospital records   Lab Tests:  I Ordered, and personally interpreted labs.  The pertinent results include: GC/committee which is pending   Imaging Studies ordered:  N/a   Cardiac Monitoring: / EKG:  The patient was maintained on a cardiac monitor.  I personally viewed and interpreted the cardiac monitored which showed an underlying rhythm of: Sinus rhythm   Consultations Obtained:  N/a   Problem List / ED Course / Critical interventions /  Medication management  STD exposure I ordered medication including Rocephin and doxycycline   Reevaluation of the patient after these medicines showed that the patient stayed the same I have reviewed the patients home medicines and have made adjustments as needed   Social Determinants of Health:  Denies tobacco use.  Some marijuana use.   Test / Admission - Considered:  STD exposure Vitals signs significant for mild hypertension with blood pressure 144/70. Otherwise within normal range and stable throughout visit. Laboratory/imaging studies significant for: See above 22 year old male presents emergency department with complaints of STD exposure more specifically chlamydia exposure.  Patient treated empirically with Rocephin and doxycycline for coverage of gonorrhea as well as chlamydia.  Offered patient additional STD testing at this time but patient declined for desire to just be tested for gonorrhea/chlamydia.  Patient overall well-appearing, afebrile in no acute distress, endorsing no symptoms at this time.  Patient recommended abstinence from sexual relations until treatment is completed.  Patient recommended follow-up primary care for reassessment of symptoms.  Treatment plan discussed with patient and he acknowledged understanding was agreeable to said plan. Worrisome signs and symptoms were discussed with the patient, and the patient acknowledged understanding to return to the ED if noticed. Patient was stable upon discharge.          Final Clinical Impression(s) / ED Diagnoses Final diagnoses:  STD exposure    Rx / DC Orders ED Discharge Orders          Ordered    doxycycline (VIBRAMYCIN) 100 MG capsule  2 times daily        04/16/23 1134              Peter Garter, [place] 04/16/23 1135    Ernie Avena, MD 04/16/23 1441

## 2023-04-16 NOTE — ED Notes (Signed)
Pt given discharge instructions and reviewed prescriptions. Opportunities given for questions. Pt verbalizes understanding. Kayde Warehime R, RN 

## 2023-04-16 NOTE — Discharge Instructions (Signed)
As discussed, take antibiotics as directed.  Recommend abstinence from sexual relations until completion of antibiotic therapy.  Please do not hesitate to return to emergency department for worrisome signs and symptoms we discussed become apparent.

## 2023-04-16 NOTE — ED Triage Notes (Signed)
Patient here POV from Home.  Endorses Possible Exposure to STD. No Symptoms.  NAD Noted during Triage. A&Ox4. Gcs 15. Ambulatory.

## 2023-04-17 ENCOUNTER — Telehealth: Payer: Self-pay

## 2023-04-17 LAB — GC/CHLAMYDIA PROBE AMP (~~LOC~~) NOT AT ARMC
Chlamydia: POSITIVE — AB
Comment: NEGATIVE
Comment: NORMAL
Neisseria Gonorrhea: NEGATIVE

## 2023-04-17 NOTE — Transitions of Care (Post Inpatient/ED Visit) (Signed)
Unable to reach pt by phone and left v/m requesting pt call [phone].        04/17/2023  Name: Ronald Thomas MRN: [id number] DOB: 2001-09-24  Today's TOC FU Call Status: Today's TOC FU Call Status:: Unsuccessul Call (1st Attempt) Unsuccessful Call (1st Attempt) Date: 04/17/23  Attempted to reach the patient regarding the most recent Inpatient/ED visit.  Follow Up Plan: Additional outreach attempts will be made to reach the patient to complete the Transitions of Care (Post Inpatient/ED visit) call.   Signature Lewanda Rife, LPN

## 2023-04-18 NOTE — Transitions of Care (Post Inpatient/ED Visit) (Signed)
Unable to reach pt by phone and left v/m for pt to cb [phone].      04/18/2023  Name: Ronald Thomas MRN: [id number] DOB: Nov 25, 2001  Today's TOC FU Call Status: Today's TOC FU Call Status:: Unsuccessful Call (2nd Attempt) Unsuccessful Call (1st Attempt) Date: 04/17/23 Unsuccessful Call (2nd Attempt) Date: 04/18/23  Attempted to reach the patient regarding the most recent Inpatient/ED visit.  Follow Up Plan: Additional outreach attempts will be made to reach the patient to complete the Transitions of Care (Post Inpatient/ED visit) call.   Signature Lewanda Rife, LPN

## 2023-04-19 NOTE — Transitions of Care (Post Inpatient/ED Visit) (Signed)
Unable to reach pt by phone and left v/m requesting pt call [phone].      04/19/2023  Name: Ronald Thomas MRN: [id number] DOB: Sep 21, 2001  Today's TOC FU Call Status: Today's TOC FU Call Status:: Unsuccessful Call (3rd Attempt) Unsuccessful Call (1st Attempt) Date: 04/17/23 Unsuccessful Call (2nd Attempt) Date: 04/18/23 Unsuccessful Call (3rd Attempt) Date: 04/19/23  Attempted to reach the patient regarding the most recent Inpatient/ED visit.  Follow Up Plan: Additional outreach attempts will be made to reach the patient to complete the Transitions of Care (Post Inpatient/ED visit) call.   Signature Lewanda Rife, LPN

## 2024-06-09 ENCOUNTER — Encounter (HOSPITAL_BASED_OUTPATIENT_CLINIC_OR_DEPARTMENT_OTHER): Payer: Self-pay | Admitting: Emergency Medicine

## 2024-06-09 ENCOUNTER — Emergency Department (HOSPITAL_BASED_OUTPATIENT_CLINIC_OR_DEPARTMENT_OTHER)
Admission: EM | Admit: 2024-06-09 | Discharge: 2024-06-09 | Disposition: A | Discharge status: Home / Self Care | Payer: Self-pay | Attending: Emergency Medicine | Admitting: Emergency Medicine

## 2024-06-09 ENCOUNTER — Emergency Department (HOSPITAL_BASED_OUTPATIENT_CLINIC_OR_DEPARTMENT_OTHER): Payer: Self-pay | Admitting: Radiology

## 2024-06-09 DIAGNOSIS — S82891A Other fracture of right lower leg, initial encounter for closed fracture: Secondary | ICD-10-CM | POA: Insufficient documentation

## 2024-06-09 DIAGNOSIS — S93402A Sprain of unspecified ligament of left ankle, initial encounter: Secondary | ICD-10-CM | POA: Insufficient documentation

## 2024-06-09 DIAGNOSIS — Y9367 Activity, basketball: Secondary | ICD-10-CM | POA: Insufficient documentation

## 2024-06-09 DIAGNOSIS — X501XXA Overexertion from prolonged static or awkward postures, initial encounter: Secondary | ICD-10-CM | POA: Insufficient documentation

## 2024-06-09 MED ORDER — HYDROCODONE-ACETAMINOPHEN 5-325 MG PO TABS
1.0000 | ORAL_TABLET | Freq: Once | ORAL | Status: AC
  Administered 2024-06-09: 1 via ORAL
  Filled 2024-06-09: qty 1

## 2024-06-09 MED ORDER — HYDROCODONE-ACETAMINOPHEN 5-325 MG PO TABS: 1.0000 | ORAL_TABLET | Freq: Four times a day (QID) | ORAL | 0 refills | Status: DC | PRN

## 2024-06-09 NOTE — ED Provider Notes (Signed)
   [place] EMERGENCY DEPARTMENT AT [facility]  Provider Note  CSN: [id number] Arrival date & time: 06/09/24 0347  History Chief Complaint  Patient presents with   Ankle Pain    Ronald Thomas is a 23 y.o. male with prior history of ORIF R 5th metatarsal is here with friend for evaluation of bilateral (R>L) ankle pain. He reports he twisted both while playing basketball earlier today in two separate injuries. He reports pain on the left is less severe and he has been able to bear weight. R is more painful and swollen.    Home Medications Prior to Admission medications   Medication Sig Start Date End Date Taking? Authorizing Provider  HYDROcodone-acetaminophen  (NORCO/VICODIN) 5-325 MG tablet Take 1 tablet by mouth every 6 (six) hours as needed for severe pain (pain score 7-10). 06/09/24  Yes Roselyn Carlin NOVAK, MD     Allergies    Patient has no known allergies.   Review of Systems   Review of Systems Please see HPI for pertinent positives and negatives  Physical Exam There were no vitals taken for this visit.  Physical Exam Vitals and nursing note reviewed.  HENT:     Head: Normocephalic.     Nose: Nose normal.   Eyes:     Extraocular Movements: Extraocular movements intact.   Pulmonary:     Effort: Pulmonary effort is normal.   Musculoskeletal:     Cervical back: Neck supple.     Comments: Tenderness and swelling to both malleoli on the R ankle. No significant swelling or tenderness to the L ankle.    Skin:    Findings: No rash (on exposed skin).   Neurological:     Mental Status: He is alert and oriented to person, place, and time.   Psychiatric:        Mood and Affect: Mood normal.     ED Results / Procedures / Treatments   EKG None  Procedures Procedures  Medications Ordered in the ED Medications  HYDROcodone-acetaminophen  (NORCO/VICODIN) 5-325 MG per tablet 1 tablet (1 tablet Oral Given 06/09/24 0414)    Initial Impression and  Plan  Patient here with bilateral ankle injuries. By history and exam, the right side is more significant. Will check xrays, pain medication for comfort.   ED Course       MDM Rules/Calculators/A&P Medical Decision Making Problems Addressed: Closed avulsion fracture of right ankle, initial encounter: acute illness or injury Sprain of left ankle, unspecified ligament, initial encounter: acute illness or injury  Amount and/or Complexity of Data Reviewed Radiology: ordered and independent interpretation performed. Decision-making details documented in ED Course.  Risk Prescription drug management.     Final Clinical Impression(s) / ED Diagnoses Final diagnoses:  Sprain of left ankle, unspecified ligament, initial encounter  Closed avulsion fracture of right ankle, initial encounter    Rx / DC Orders ED Discharge Orders          Ordered    HYDROcodone-acetaminophen  (NORCO/VICODIN) 5-325 MG tablet  Every 6 hours PRN        06/09/24 0538             Roselyn Carlin NOVAK, MD 06/09/24 [phone]

## 2024-06-09 NOTE — ED Triage Notes (Signed)
 C/o bilateral ankle pain from playing basketball yesterday. Swollen.

## 2024-09-03 ENCOUNTER — Ambulatory Visit: Payer: Self-pay

## 2024-09-09 ENCOUNTER — Ambulatory Visit: Payer: Self-pay

## 2024-09-10 ENCOUNTER — Encounter: Payer: Self-pay | Admitting: *Deleted

## 2024-09-10 ENCOUNTER — Inpatient Hospital Stay: Admission: RE | Admit: 2024-09-10 | Payer: Self-pay | Source: Ambulatory Visit

## 2024-09-10 ENCOUNTER — Ambulatory Visit
Admission: EM | Admit: 2024-09-10 | Discharge: 2024-09-10 | Disposition: A | Discharge status: Home / Self Care | Payer: Self-pay | Attending: Nurse Practitioner | Admitting: Nurse Practitioner

## 2024-09-10 ENCOUNTER — Other Ambulatory Visit: Payer: Self-pay

## 2024-09-10 DIAGNOSIS — Z113 Encounter for screening for infections with a predominantly sexual mode of transmission: Secondary | ICD-10-CM | POA: Insufficient documentation

## 2024-09-10 NOTE — ED Triage Notes (Signed)
 Pt requesting swap for gonorrhea and chlamydia. Denies Sx

## 2024-09-10 NOTE — ED Provider Notes (Signed)
 [facility]    CSN: [id number] Arrival date & time: 09/10/24  0907      History   Chief Complaint Chief Complaint  Patient presents with   Labs Only    HPI Ronald Thomas is a 23 y.o. male.   Discussed the use of AI scribe software for clinical note transcription with the patient, who gave verbal consent to proceed.   Patient presents for routine STD testing. The patient reports no current symptoms including no pain or burning with urination, no penile discharge, no sores on the penis, and no genital pain. The patient denies known or suspected exposure to sexually transmitted infections. The patient reports having two male sexual partners in the past three months and uses condoms sometimes. The patient has a history of chlamydia diagnosed in April of last year which was treated with medication.  The following sections of the patient's history were reviewed and updated as appropriate: allergies, current medications, past family history, past medical history, past social history, past surgical history, and problem list.     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       Home Medications    Prior to Admission medications   Not on File    Family History Family History  Problem Relation Age of Onset   Stroke Mother    Stroke Maternal Grandmother    Diabetes Maternal Grandfather    High blood pressure Maternal  Grandfather    Heart attack Paternal Grandmother    Diabetes Paternal Grandfather    Heart attack Paternal Grandfather    Cancer Paternal Aunt     Social History Social History   Tobacco Use   Smoking status: Never   Smokeless tobacco: Never  Vaping Use   Vaping status: Never Used  Substance Use Topics   Alcohol use: Yes    Comment: weekends   Drug use: Not Currently    Types: Marijuana    Comment: last time last month     Allergies   Patient has no known allergies.   Review of Systems Review of Systems  Genitourinary:  Negative for dysuria, genital sores, penile discharge, penile pain, penile swelling, scrotal swelling and testicular pain.  All other systems reviewed and are negative.    Physical Exam Triage Vital Signs ED Triage Vitals  Encounter Vitals Group     BP 09/10/24 0938 132/72     Girls Systolic BP Percentile --      Girls Diastolic BP Percentile --      Boys Systolic BP Percentile --      Boys Diastolic BP Percentile --      Pulse Rate 09/10/24 0938 (!) 52     Resp 09/10/24 0938 16     Temp 09/10/24 0938 98 F (36.7 C)     Temp Source 09/10/24 0938 Oral     SpO2 09/10/24 0938 99 %  Weight --      Height --      Head Circumference --      Peak Flow --      Pain Score 09/10/24 0936 0     Pain Loc --      Pain Education --      Exclude from Growth Chart --    No data found.  Updated Vital Signs BP 132/72 (BP Location: Left Arm)   Pulse (!) 52   Temp 98 F (36.7 C) (Oral)   Resp 16   SpO2 99%   Visual Acuity Right Eye Distance:   Left Eye Distance:   Bilateral Distance:    Right Eye Near:   Left Eye Near:    Bilateral Near:     Physical Exam Constitutional:      General: He is not in acute distress.    Appearance: Normal appearance. He is not ill-appearing, toxic-appearing or diaphoretic.  HENT:     Head: Normocephalic.     Nose: Nose normal.     Mouth/Throat:     Mouth: Mucous membranes are moist.  Eyes:      Conjunctiva/sclera: Conjunctivae normal.  Cardiovascular:     Rate and Rhythm: Normal rate.  Pulmonary:     Effort: Pulmonary effort is normal.  Abdominal:     Palpations: Abdomen is soft.  Genitourinary:    Comments: Deferred; patient performed self-swab for Aptima testing  Musculoskeletal:        General: Normal range of motion.     Cervical back: Normal range of motion and neck supple.  Skin:    General: Skin is warm and dry.  Neurological:     General: No focal deficit present.     Mental Status: He is alert and oriented to person, place, and time.  Psychiatric:        Mood and Affect: Mood normal.        Behavior: Behavior normal.      UC Treatments / Results  Labs (all labs ordered are listed, but only abnormal results are displayed) Labs Reviewed  CYTOLOGY, (ORAL, ANAL, URETHRAL) ANCILLARY ONLY    EKG   Radiology No results found.  Procedures Procedures (including critical care time)  Medications Ordered in UC Medications - No data to display  Initial Impression / Assessment and Plan / UC Course  I have reviewed the triage vital signs and the nursing notes.  Pertinent labs & imaging results that were available during my care of the patient were reviewed by me and considered in my medical decision making (see chart for details).     Patient presents for STD testing. Tests obtained today include gonorrhea, chlamydia and trichomonas. Patient declined testing for HIV, and syphilis. Results are pending. Patient was counseled to abstain from sexual activity until all results have been received, any necessary treatment has been completed, and any symptoms, if present, have resolved. Safe sex practices were discussed and encouraged, including consistent condom use and regular screening with new partners. Patient advised they will only be contacted if any results are positive or require follow-up; otherwise, they may review their results through MyChart.  Today's  evaluation has revealed no signs of a dangerous process. Discussed diagnosis with patient and/or guardian. Patient and/or guardian aware of their diagnosis, possible red flag symptoms to watch out for and need for close follow up. Patient and/or guardian understands verbal and written discharge instructions. Patient and/or guardian comfortable with plan and disposition.  Patient and/or guardian has  a clear mental status at this time, good insight into illness (after discussion and teaching) and has clear judgment to make decisions regarding their care  Documentation was completed with the aid of voice recognition software. Transcription may contain typographical errors.   Final Clinical Impressions(s) / UC Diagnoses   Final diagnoses:  Screen for STD (sexually transmitted disease)     Discharge Instructions      Testing for gonorrhea, chlamydia and trichomonas is pending. You should not have any sexual activity until you receive the results of the tests. You will only be notified for positive results. You may go online to MyChart and review your results. Practice safe sex practices by wearing a condom every time you have sex. Remember that people who have STIs may not experience any symptoms. However, even without symptoms, these infections can be spread from person to person and require treatment. STIs can be treated, and many STIs can be cured. However, some STIs cannot be cured and will affect you for the rest of your life. It's important to be checked regularly for STIs. You should also consider taking pre-exposure prophylaxis (PrEP) to prevent HIV infection.      ED Prescriptions   None    PDMP not reviewed this encounter.   Iola Lukes, [place] 09/10/24 1017

## 2024-09-10 NOTE — Discharge Instructions (Addendum)
 Testing for gonorrhea, chlamydia and trichomonas is pending. You should not have any sexual activity until you receive the results of the tests. You will only be notified for positive results. You may go online to MyChart and review your results. Practice safe sex practices by wearing a condom every time you have sex. Remember that people who have STIs may not experience any symptoms. However, even without symptoms, these infections can be spread from person to person and require treatment. STIs can be treated, and many STIs can be cured. However, some STIs cannot be cured and will affect you for the rest of your life. It's important to be checked regularly for STIs. You should also consider taking pre-exposure prophylaxis (PrEP) to prevent HIV infection.

## 2024-09-11 ENCOUNTER — Ambulatory Visit (HOSPITAL_COMMUNITY): Payer: Self-pay

## 2024-09-11 LAB — CYTOLOGY, (ORAL, ANAL, URETHRAL) ANCILLARY ONLY
Chlamydia: POSITIVE — AB
Comment: NEGATIVE
Comment: NEGATIVE
Comment: NORMAL
Neisseria Gonorrhea: NEGATIVE
Trichomonas: NEGATIVE

## 2024-09-11 MED ORDER — DOXYCYCLINE HYCLATE 100 MG PO TABS: 100.0000 mg | ORAL_TABLET | Freq: Two times a day (BID) | ORAL | 0 refills | Status: AC
